# Patient Record
Sex: Male | Born: 1960 | Race: White | Hispanic: No | Marital: Married
Health system: Southern US, Community
[De-identification: ages and names within clinical notes are randomized; demographics above are authoritative.]

## PROBLEM LIST (undated history)

## (undated) DIAGNOSIS — R1312 Dysphagia, oropharyngeal phase: Secondary | ICD-10-CM

## (undated) DIAGNOSIS — U071 COVID-19: Secondary | ICD-10-CM

## (undated) DIAGNOSIS — A419 Sepsis, unspecified organism: Secondary | ICD-10-CM

## (undated) DIAGNOSIS — I10 Essential (primary) hypertension: Secondary | ICD-10-CM

## (undated) DIAGNOSIS — E119 Type 2 diabetes mellitus without complications: Secondary | ICD-10-CM

## (undated) DIAGNOSIS — E1140 Type 2 diabetes mellitus with diabetic neuropathy, unspecified: Secondary | ICD-10-CM

## (undated) DIAGNOSIS — J80 Acute respiratory distress syndrome: Secondary | ICD-10-CM

## (undated) DIAGNOSIS — I714 Abdominal aortic aneurysm, without rupture, unspecified: Secondary | ICD-10-CM

## (undated) DIAGNOSIS — R6520 Severe sepsis without septic shock: Secondary | ICD-10-CM

## (undated) DIAGNOSIS — J9621 Acute and chronic respiratory failure with hypoxia: Secondary | ICD-10-CM

## (undated) DIAGNOSIS — G7281 Critical illness myopathy: Secondary | ICD-10-CM

## (undated) DIAGNOSIS — K219 Gastro-esophageal reflux disease without esophagitis: Secondary | ICD-10-CM

## (undated) HISTORY — PX: LUMBAR FUSION: SHX111

## (undated) HISTORY — PX: VENTRAL HERNIA REPAIR: SHX424

---

## 2020-08-11 ENCOUNTER — Inpatient Hospital Stay
Admission: RE | Admit: 2020-08-11 | Discharge: 2020-08-30 | Disposition: A | Discharge status: Rehabilitation Facility | Payer: Medicare Other | Attending: Internal Medicine | Admitting: Internal Medicine

## 2020-08-11 ENCOUNTER — Other Ambulatory Visit (HOSPITAL_COMMUNITY): Payer: Medicare Other

## 2020-08-11 DIAGNOSIS — U071 COVID-19: Secondary | ICD-10-CM | POA: Diagnosis present

## 2020-08-11 DIAGNOSIS — G7281 Critical illness myopathy: Secondary | ICD-10-CM | POA: Diagnosis present

## 2020-08-11 DIAGNOSIS — Z8616 Personal history of COVID-19: Secondary | ICD-10-CM

## 2020-08-11 DIAGNOSIS — T85598A Other mechanical complication of other gastrointestinal prosthetic devices, implants and grafts, initial encounter: Secondary | ICD-10-CM

## 2020-08-11 DIAGNOSIS — J9621 Acute and chronic respiratory failure with hypoxia: Secondary | ICD-10-CM | POA: Diagnosis present

## 2020-08-11 DIAGNOSIS — J9690 Respiratory failure, unspecified, unspecified whether with hypoxia or hypercapnia: Secondary | ICD-10-CM

## 2020-08-11 DIAGNOSIS — R0602 Shortness of breath: Secondary | ICD-10-CM

## 2020-08-11 DIAGNOSIS — R1312 Dysphagia, oropharyngeal phase: Secondary | ICD-10-CM | POA: Diagnosis present

## 2020-08-11 DIAGNOSIS — J80 Acute respiratory distress syndrome: Secondary | ICD-10-CM | POA: Diagnosis present

## 2020-08-11 DIAGNOSIS — A419 Sepsis, unspecified organism: Secondary | ICD-10-CM | POA: Diagnosis present

## 2020-08-11 HISTORY — DX: Acute and chronic respiratory failure with hypoxia: J96.21

## 2020-08-11 HISTORY — DX: Severe sepsis without septic shock: R65.20

## 2020-08-11 HISTORY — DX: COVID-19: U07.1

## 2020-08-11 HISTORY — DX: Dysphagia, oropharyngeal phase: R13.12

## 2020-08-11 HISTORY — DX: Abdominal aortic aneurysm, without rupture, unspecified: I71.40

## 2020-08-11 HISTORY — DX: Acute respiratory distress syndrome: J80

## 2020-08-11 HISTORY — DX: Abdominal aortic aneurysm, without rupture: I71.4

## 2020-08-11 HISTORY — DX: Type 2 diabetes mellitus without complications: E11.9

## 2020-08-11 HISTORY — DX: Gastro-esophageal reflux disease without esophagitis: K21.9

## 2020-08-11 HISTORY — DX: Type 2 diabetes mellitus with diabetic neuropathy, unspecified: E11.40

## 2020-08-11 HISTORY — DX: Critical illness myopathy: G72.81

## 2020-08-11 HISTORY — DX: Essential (primary) hypertension: I10

## 2020-08-11 HISTORY — DX: Sepsis, unspecified organism: A41.9

## 2020-08-12 ENCOUNTER — Encounter: Payer: Self-pay | Admitting: Internal Medicine

## 2020-08-12 ENCOUNTER — Other Ambulatory Visit (HOSPITAL_COMMUNITY): Payer: Medicare Other

## 2020-08-12 DIAGNOSIS — U071 COVID-19: Secondary | ICD-10-CM | POA: Diagnosis present

## 2020-08-12 DIAGNOSIS — A419 Sepsis, unspecified organism: Secondary | ICD-10-CM | POA: Diagnosis present

## 2020-08-12 DIAGNOSIS — J9621 Acute and chronic respiratory failure with hypoxia: Secondary | ICD-10-CM | POA: Diagnosis present

## 2020-08-12 DIAGNOSIS — G7281 Critical illness myopathy: Secondary | ICD-10-CM | POA: Diagnosis not present

## 2020-08-12 DIAGNOSIS — R1312 Dysphagia, oropharyngeal phase: Secondary | ICD-10-CM | POA: Diagnosis not present

## 2020-08-12 DIAGNOSIS — R6520 Severe sepsis without septic shock: Secondary | ICD-10-CM

## 2020-08-12 DIAGNOSIS — J80 Acute respiratory distress syndrome: Secondary | ICD-10-CM | POA: Diagnosis present

## 2020-08-12 LAB — BASIC METABOLIC PANEL
Anion gap: 14 (ref 5–15)
BUN: 14 mg/dL (ref 6–20)
CO2: 23 mmol/L (ref 22–32)
Calcium: 8.8 mg/dL — ABNORMAL LOW (ref 8.9–10.3)
Chloride: 100 mmol/L (ref 98–111)
Creatinine, Ser: 0.63 mg/dL (ref 0.61–1.24)
GFR, Estimated: >60 mL/min (ref >60)
Glucose, Bld: 147 mg/dL — ABNORMAL HIGH (ref 70–99)
Potassium: 4.1 mmol/L (ref 3.5–5.1)
Sodium: 137 mmol/L (ref 135–145)

## 2020-08-12 LAB — CBC
HCT: 37.5 % — ABNORMAL LOW (ref 39.0–52.0)
Hemoglobin: 11.6 g/dL — ABNORMAL LOW (ref 13.0–17.0)
MCH: 27.5 pg (ref 26.0–34.0)
MCHC: 30.9 g/dL (ref 30.0–36.0)
MCV: 88.9 fL (ref 80.0–100.0)
Platelets: 307 10*3/uL (ref 150–400)
RBC: 4.22 MIL/uL (ref 4.22–5.81)
RDW: 15.1 % (ref 11.5–15.5)
WBC: 9.7 10*3/uL (ref 4.0–10.5)
nRBC: 0 % (ref 0.0–0.2)

## 2020-08-12 NOTE — Consult Note (Signed)
Pulmonary Critical Care Medicine [facility] [place]  PULMONARY SERVICE  Date of Service: 08/12/2020  PULMONARY CRITICAL CARE CONSULT   Willie Frederick  [id number]:[id number]  DOB: [birth year]   DOA: 08/11/2020  Referring Physician: Carron Curie, MD  HPI: Willie Frederick is a 59 y.o. male seen for follow up of Acute on Chronic Respiratory Failure.  Patient with multiple medical problems including hypertension diabetes diabetic neuropathy hyperlipidemia came into the hospital because of shortness of breath.  Patient also had another family member that was positive for COVID-19.  Then some cough and congestion which will been going on for about 2 weeks patient evaluated at that time came back positive for COVID-19.  On presentation to the ER saturations were about 85% so patient was admitted for further evaluation.  Hospital course is as follows.  Patient apparently had worsening of respiratory status was intubated placed on mechanical ventilation.  The patient was on the ventilator ET tube was dislodged had difficulty reintubating.  Subsequently patient ended up having to have a tracheostomy done.  Now is transferred for further management and weaning  Review of Systems:  ROS performed and is unremarkable other than noted above.  PAST MEDICAL AND PAST SURGICAL HISTORY: 1. Hypertension. 2. Hyperlipidemia. 3. Diabetes type 2. 4. Osteoarthritis. 5. History of lung disease. 6. History of gastroesophageal reflux disease. 7. History of wisdom tooth extraction. 8. History of ventral hernia repair. 9. History of lumbar spine fusion. 10. History of gout.  ALLERGIES: THE PATIENT NOTED TO BE ALLERGIC TO LYRICA AND CYMBALTA.  MEDICATIONS: 1. Doxycycline 100 mg p.o. b.i.d. 2. Albuterol HFA 2 puffs inhaled q.6 hours p.r.n. 3. Vitamin D3 5000 units p.o. every day. 4. Lipitor 40 mg p.o. at bedtime. 5. Alogliptin 25 mg p.o. every day. 6. Lisinopril 40 mg p.o. at bedtime. 7. Metformin  1000 mg p.o. b.i.d. 8. Glipizide 5 mg p.o. b.i.d. 9. Propranolol 80 mg p.o. at bedtime. 10. Allopurinol 300 mg p.o. every day.  SOCIAL HISTORY:  Tobacco use history: The patient denies.  Alcohol use history: The patient denies.  Illegal drug history: The patient denies.  Living condition: The patient lives alone.  FAMILY HISTORY: Significant for hypertension, coronary artery disease in several family members.   Physical Exam:  Vitals: Temperature is 96.7 pulse 102 respiratory rate 30 blood pressure is 137/75 saturations 98%  Ventilator Settings on T collar FiO2 35%  . General: Comfortable at this time . Eyes: Grossly normal lids, irises & conjunctiva . ENT: grossly tongue is normal . Neck: no obvious mass . Cardiovascular: S1-S2 normal no gallop or rub . Respiratory: No rhonchi very coarse breath sounds . Abdomen: Soft and nontender . Skin: no rash seen on limited exam . Musculoskeletal: not rigid . Psychiatric:unable to assess . Neurologic: no seizure no involuntary movements         Labs on Admission:  Basic Metabolic Panel: Recent Labs  Lab 08/12/20 0430  NA 137  K 4.1  CL 100  CO2 23  GLUCOSE 147*  BUN 14  CREATININE 0.63  CALCIUM 8.8*    No results for input(s): PHART, PCO2ART, PO2ART, HCO3, O2SAT in the last 168 hours.  Liver Function Tests: No results for input(s): AST, ALT, ALKPHOS, BILITOT, PROT, ALBUMIN in the last 168 hours. No results for input(s): LIPASE, AMYLASE in the last 168 hours. No results for input(s): AMMONIA in the last 168 hours.  CBC: Recent Labs  Lab 08/12/20 0430  WBC 9.7  HGB 11.6*  HCT 37.5*  MCV 88.9  PLT 307    Cardiac Enzymes: No results for input(s): CKTOTAL, CKMB, CKMBINDEX, TROPONINI in the last 168 hours.  BNP (last 3 results) No results for input(s): BNP in the last 8760 hours.  ProBNP (last 3 results) No results for input(s): PROBNP in the last 8760 hours.   Radiological Exams on Admission: DG Chest  Port 1 View  Result Date: 08/12/2020 CLINICAL DATA:  Respiratory failure EXAM: PORTABLE CHEST 1 VIEW COMPARISON:  None. FINDINGS: Tracheostomy in place overlying its expected position. Nasogastric tube extends into the upper abdomen overlying the expected mid body of the stomach. Lung volumes are small. There is focal consolidation within the right lung base peripherally, likely infectious or inflammatory. No pneumothorax or pleural effusion. Cardiac size within normal limits. Pulmonary vascularity is normal. No acute bone abnormality. IMPRESSION: Focal consolidation within the right lung base, possibly reflecting changes acute lobar pneumonia in the appropriate clinical setting. Pulmonary hypoinflation. Electronically Signed   By: Helyn Numbers MD   On: 08/12/2020 02:53   DG Abd Portable 1V  Result Date: 08/11/2020 CLINICAL DATA:  Impaired nasogastric feeding tube, initial encounter. Status post NG tube placement. EXAM: PORTABLE ABDOMEN - 1 VIEW COMPARISON:  None. FINDINGS: Tip and side port of the enteric tube are in the stomach. The enteric tube is looped in the stomach, tip in the region of the pylorus. There is no kink in the enteric tube. Mild gaseous gastric distension. Air within nondilated small and large bowel, nonobstructive. Patchy bilateral lung opacities in the included lung bases. IMPRESSION: Enteric tube looped in the stomach with the tip in the region of the pylorus. Electronically Signed   By: Narda Rutherford M.D.   On: 08/11/2020 20:19    Assessment/Plan Active Problems:   Acute on chronic respiratory failure with hypoxia (HCC)   COVID-19 virus infection   Oropharyngeal dysphagia   Severe sepsis (HCC)   Acute respiratory distress syndrome (ARDS) due to COVID-19 virus [facility])   Critical illness myopathy   1. Acute on chronic respiratory failure hypoxia plan is to continue with the T collar titrate oxygen continue pulmonary toilet secretions are fair to moderate. 2. COVID-19  virus infection recovery phase we will continue to follow along. 3. Oropharyngeal dysphagia possible vocal cord paralysis patient apparently had some dysphagia noted with gross aspiration on barium swallow from December 20 will need to have speech therapy evaluate. 4. Severe sepsis treated resolved we will continue to follow along. 5. ARDS due to COVID-19 treated 6. 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 The Patient requires high complexity decision making with multiple systems involvement.  Case was discussed on Rounds with the Respiratory Therapy Director and the Respiratory staff Time Spent  Yevonne Pax, MD [facility] Pulmonary Critical Care Medicine Sleep Medicine

## 2020-08-13 DIAGNOSIS — U071 COVID-19: Secondary | ICD-10-CM | POA: Diagnosis not present

## 2020-08-13 DIAGNOSIS — G7281 Critical illness myopathy: Secondary | ICD-10-CM | POA: Diagnosis not present

## 2020-08-13 DIAGNOSIS — R1312 Dysphagia, oropharyngeal phase: Secondary | ICD-10-CM | POA: Diagnosis not present

## 2020-08-13 DIAGNOSIS — J9621 Acute and chronic respiratory failure with hypoxia: Secondary | ICD-10-CM | POA: Diagnosis not present

## 2020-08-13 NOTE — Progress Notes (Signed)
Pulmonary Critical Care Medicine [facility] [place]   PULMONARY CRITICAL CARE SERVICE  PROGRESS NOTE  Date of Service: 08/13/2020  Willie Frederick  [id number]  DOB: 01/14/1961   DOA: 08/11/2020  Referring Physician: Carron Curie, MD  HPI: Willie Frederick is a 60 y.o. male seen for follow up of Acute on Chronic Respiratory Failure.  Remains on T collar currently on 35% FiO2 has a #8 tracheostomy in place ready for downsizing  Medications: Reviewed on Rounds  Physical Exam:  Vitals: Temperature is 97.6 pulse 95 respiratory 26 blood pressure is 130/75 saturations 92%  Ventilator Settings on T collar FiO2 35  . General: Comfortable at this time . Eyes: Grossly normal lids, irises & conjunctiva . ENT: grossly tongue is normal . Neck: no obvious mass . Cardiovascular: S1 S2 normal no gallop . Respiratory: No rhonchi no rales . Abdomen: soft . Skin: no rash seen on limited exam . Musculoskeletal: not rigid . Psychiatric:unable to assess . Neurologic: no seizure no involuntary movements         Lab Data:   Basic Metabolic Panel: Recent Labs  Lab 08/12/20 0430  NA 137  K 4.1  CL 100  CO2 23  GLUCOSE 147*  BUN 14  CREATININE 0.63  CALCIUM 8.8*    ABG: No results for input(s): PHART, PCO2ART, PO2ART, HCO3, O2SAT in the last 168 hours.  Liver Function Tests: No results for input(s): AST, ALT, ALKPHOS, BILITOT, PROT, ALBUMIN in the last 168 hours. No results for input(s): LIPASE, AMYLASE in the last 168 hours. No results for input(s): AMMONIA in the last 168 hours.  CBC: Recent Labs  Lab 08/12/20 0430  WBC 9.7  HGB 11.6*  HCT 37.5*  MCV 88.9  PLT 307    Cardiac Enzymes: No results for input(s): CKTOTAL, CKMB, CKMBINDEX, TROPONINI in the last 168 hours.  BNP (last 3 results) No results for input(s): BNP in the last 8760 hours.  ProBNP (last 3 results) No results for input(s): PROBNP in the last 8760 hours.  Radiological  Exams: DG Chest Port 1 View  Result Date: 08/12/2020 CLINICAL DATA:  Respiratory failure EXAM: PORTABLE CHEST 1 VIEW COMPARISON:  None. FINDINGS: Tracheostomy in place overlying its expected position. Nasogastric tube extends into the upper abdomen overlying the expected mid body of the stomach. Lung volumes are small. There is focal consolidation within the right lung base peripherally, likely infectious or inflammatory. No pneumothorax or pleural effusion. Cardiac size within normal limits. Pulmonary vascularity is normal. No acute bone abnormality. IMPRESSION: Focal consolidation within the right lung base, possibly reflecting changes acute lobar pneumonia in the appropriate clinical setting. Pulmonary hypoinflation. Electronically Signed   By: Helyn Numbers MD   On: 08/12/2020 02:53   DG Abd Portable 1V  Result Date: 08/11/2020 CLINICAL DATA:  Impaired nasogastric feeding tube, initial encounter. Status post NG tube placement. EXAM: PORTABLE ABDOMEN - 1 VIEW COMPARISON:  None. FINDINGS: Tip and side port of the enteric tube are in the stomach. The enteric tube is looped in the stomach, tip in the region of the pylorus. There is no kink in the enteric tube. Mild gaseous gastric distension. Air within nondilated small and large bowel, nonobstructive. Patchy bilateral lung opacities in the included lung bases. IMPRESSION: Enteric tube looped in the stomach with the tip in the region of the pylorus. Electronically Signed   By: Narda Rutherford M.D.   On: 08/11/2020 20:19    Assessment/Plan Active Problems:   Acute on chronic  respiratory failure with hypoxia (HCC)   COVID-19 virus infection   Oropharyngeal dysphagia   Severe sepsis (HCC)   Acute respiratory distress syndrome (ARDS) due to COVID-19 virus [facility])   Critical illness myopathy   1. Acute on chronic respiratory failure hypoxia continue T-piece titrate oxygen continue pulmonary toilet 2. COVID-19 virus infection in  recovery 3. Oropharyngeal dysphagia no change 4. Severe sepsis resolved 5. ARDS treated 6. Critical illness myopathy no change   I have personally seen and evaluated the patient, evaluated laboratory and imaging results, formulated the assessment and plan and placed orders. The Patient requires high complexity decision making with multiple systems involvement.  Rounds were done with the Respiratory Therapy Director and Staff therapists and discussed with nursing staff also.  Yevonne Pax, MD [facility] Pulmonary Critical Care Medicine Sleep Medicine

## 2020-08-14 DIAGNOSIS — R1312 Dysphagia, oropharyngeal phase: Secondary | ICD-10-CM | POA: Diagnosis not present

## 2020-08-14 DIAGNOSIS — G7281 Critical illness myopathy: Secondary | ICD-10-CM | POA: Diagnosis not present

## 2020-08-14 DIAGNOSIS — U071 COVID-19: Secondary | ICD-10-CM | POA: Diagnosis not present

## 2020-08-14 DIAGNOSIS — J9621 Acute and chronic respiratory failure with hypoxia: Secondary | ICD-10-CM | POA: Diagnosis not present

## 2020-08-14 NOTE — Progress Notes (Signed)
Pulmonary Critical Care Medicine [facility] [place]   PULMONARY CRITICAL CARE SERVICE  PROGRESS NOTE  Date of Service: 08/14/2020  Willie Frederick  [id number]  DOB: 05-25-1961   DOA: 08/11/2020  Referring Physician: Carron Curie, MD  HPI: Willie Frederick is a 60 y.o. male seen for follow up of Acute on Chronic Respiratory Failure.  Patient is on T collar currently 40% FiO2 good saturations are noted at this time  Medications: Reviewed on Rounds  Physical Exam:  Vitals: Temperature is 97.6 pulse 93 respiratory rate 30 blood pressure is 129/77 saturations 93%  Ventilator Settings on T collar with an FiO2 of 40%  . General: Comfortable at this time . Eyes: Grossly normal lids, irises & conjunctiva . ENT: grossly tongue is normal . Neck: no obvious mass . Cardiovascular: S1 S2 normal no gallop . Respiratory: No rhonchi no rales are noted at this time . Abdomen: soft . Skin: no rash seen on limited exam . Musculoskeletal: not rigid . Psychiatric:unable to assess . Neurologic: no seizure no involuntary movements         Lab Data:   Basic Metabolic Panel: Recent Labs  Lab 08/12/20 0430  NA 137  K 4.1  CL 100  CO2 23  GLUCOSE 147*  BUN 14  CREATININE 0.63  CALCIUM 8.8*    ABG: No results for input(s): PHART, PCO2ART, PO2ART, HCO3, O2SAT in the last 168 hours.  Liver Function Tests: No results for input(s): AST, ALT, ALKPHOS, BILITOT, PROT, ALBUMIN in the last 168 hours. No results for input(s): LIPASE, AMYLASE in the last 168 hours. No results for input(s): AMMONIA in the last 168 hours.  CBC: Recent Labs  Lab 08/12/20 0430  WBC 9.7  HGB 11.6*  HCT 37.5*  MCV 88.9  PLT 307    Cardiac Enzymes: No results for input(s): CKTOTAL, CKMB, CKMBINDEX, TROPONINI in the last 168 hours.  BNP (last 3 results) No results for input(s): BNP in the last 8760 hours.  ProBNP (last 3 results) No results for input(s): PROBNP in the last 8760  hours.  Radiological Exams: No results found.  Assessment/Plan Active Problems:   Acute on chronic respiratory failure with hypoxia (HCC)   COVID-19 virus infection   Oropharyngeal dysphagia   Severe sepsis (HCC)   Acute respiratory distress syndrome (ARDS) due to COVID-19 virus [facility])   Critical illness myopathy   1. Acute on chronic respiratory failure hypoxia we will continue with T-piece titrate oxygen continue pulmonary toilet. 2. COVID-19 virus infection recovery phase 3. Oropharyngeal dysphagia no change 4. Severe sepsis resolved 5. ARDS improving 6. Critical illness myopathy needs ongoing therapy   I have personally seen and evaluated the patient, evaluated laboratory and imaging results, formulated the assessment and plan and placed orders. The Patient requires high complexity decision making with multiple systems involvement.  Rounds were done with the Respiratory Therapy Director and Staff therapists and discussed with nursing staff also.  Yevonne Pax, MD [facility] Pulmonary Critical Care Medicine Sleep Medicine

## 2020-08-15 DIAGNOSIS — R1312 Dysphagia, oropharyngeal phase: Secondary | ICD-10-CM | POA: Diagnosis not present

## 2020-08-15 DIAGNOSIS — J9621 Acute and chronic respiratory failure with hypoxia: Secondary | ICD-10-CM | POA: Diagnosis not present

## 2020-08-15 DIAGNOSIS — U071 COVID-19: Secondary | ICD-10-CM | POA: Diagnosis not present

## 2020-08-15 DIAGNOSIS — G7281 Critical illness myopathy: Secondary | ICD-10-CM | POA: Diagnosis not present

## 2020-08-15 LAB — CBC
HCT: 36 % — ABNORMAL LOW (ref 39.0–52.0)
Hemoglobin: 11.9 g/dL — ABNORMAL LOW (ref 13.0–17.0)
MCH: 28.5 pg (ref 26.0–34.0)
MCHC: 33.1 g/dL (ref 30.0–36.0)
MCV: 86.3 fL (ref 80.0–100.0)
Platelets: 228 10*3/uL (ref 150–400)
RBC: 4.17 MIL/uL — ABNORMAL LOW (ref 4.22–5.81)
RDW: 15.5 % (ref 11.5–15.5)
WBC: 13.4 10*3/uL — ABNORMAL HIGH (ref 4.0–10.5)
nRBC: 0 % (ref 0.0–0.2)

## 2020-08-15 LAB — BASIC METABOLIC PANEL
Anion gap: 11 (ref 5–15)
BUN: 31 mg/dL — ABNORMAL HIGH (ref 6–20)
CO2: 28 mmol/L (ref 22–32)
Calcium: 9.4 mg/dL (ref 8.9–10.3)
Chloride: 101 mmol/L (ref 98–111)
Creatinine, Ser: 0.68 mg/dL (ref 0.61–1.24)
GFR, Estimated: >60 mL/min (ref >60)
Glucose, Bld: 337 mg/dL — ABNORMAL HIGH (ref 70–99)
Potassium: 4.8 mmol/L (ref 3.5–5.1)
Sodium: 140 mmol/L (ref 135–145)

## 2020-08-15 NOTE — Progress Notes (Signed)
Pulmonary Critical Care Medicine [facility] [place]   PULMONARY CRITICAL CARE SERVICE  PROGRESS NOTE  Date of Service: 08/15/2020  Willie Frederick  [id number]  DOB: 1961-01-31   DOA: 08/11/2020  Referring Physician: Carron Curie, MD  HPI: Willie Frederick is a 60 y.o. male seen for follow up of Acute on Chronic Respiratory Failure.  On T collar right now on 40% FiO2 good saturations are noted at this time  Medications: Reviewed on Rounds  Physical Exam:  Vitals: Temperature is 97.1 pulse 84 respiratory 22 blood pressure is 125/85 saturations 94%  Ventilator Settings patient is on 40% FiO2  . General: Comfortable at this time . Eyes: Grossly normal lids, irises & conjunctiva . ENT: grossly tongue is normal . Neck: no obvious mass . Cardiovascular: S1 S2 normal no gallop . Respiratory: No rhonchi no rales noted at this time . Abdomen: soft . Skin: no rash seen on limited exam . Musculoskeletal: not rigid . Psychiatric:unable to assess . Neurologic: no seizure no involuntary movements         Lab Data:   Basic Metabolic Panel: Recent Labs  Lab 08/12/20 0430 08/15/20 0504  NA 137 140  K 4.1 4.8  CL 100 101  CO2 23 28  GLUCOSE 147* 337*  BUN 14 31*  CREATININE 0.63 0.68  CALCIUM 8.8* 9.4    ABG: No results for input(s): PHART, PCO2ART, PO2ART, HCO3, O2SAT in the last 168 hours.  Liver Function Tests: No results for input(s): AST, ALT, ALKPHOS, BILITOT, PROT, ALBUMIN in the last 168 hours. No results for input(s): LIPASE, AMYLASE in the last 168 hours. No results for input(s): AMMONIA in the last 168 hours.  CBC: Recent Labs  Lab 08/12/20 0430 08/15/20 0504  WBC 9.7 13.4*  HGB 11.6* 11.9*  HCT 37.5* 36.0*  MCV 88.9 86.3  PLT 307 228    Cardiac Enzymes: No results for input(s): CKTOTAL, CKMB, CKMBINDEX, TROPONINI in the last 168 hours.  BNP (last 3 results) No results for input(s): BNP in the last 8760 hours.  ProBNP (last 3  results) No results for input(s): PROBNP in the last 8760 hours.  Radiological Exams: No results found.  Assessment/Plan Active Problems:   Acute on chronic respiratory failure with hypoxia (HCC)   COVID-19 virus infection   Oropharyngeal dysphagia   Severe sepsis (HCC)   Acute respiratory distress syndrome (ARDS) due to COVID-19 virus [facility])   Critical illness myopathy   1. Acute on chronic respiratory failure hypoxia we will continue with T collar titrate oxygen continue pulmonary toilet 2. COVID-19 virus infection recovery 3. Oropharyngeal dysphagia no change 4. Severe sepsis resolved 5. ARDS treated 6. Critical illness myopathy no change   I have personally seen and evaluated the patient, evaluated laboratory and imaging results, formulated the assessment and plan and placed orders. The Patient requires high complexity decision making with multiple systems involvement.  Rounds were done with the Respiratory Therapy Director and Staff therapists and discussed with nursing staff also.  Yevonne Pax, MD [facility] Pulmonary Critical Care Medicine Sleep Medicine

## 2020-08-16 DIAGNOSIS — G7281 Critical illness myopathy: Secondary | ICD-10-CM | POA: Diagnosis not present

## 2020-08-16 DIAGNOSIS — U071 COVID-19: Secondary | ICD-10-CM | POA: Diagnosis not present

## 2020-08-16 DIAGNOSIS — R1312 Dysphagia, oropharyngeal phase: Secondary | ICD-10-CM | POA: Diagnosis not present

## 2020-08-16 DIAGNOSIS — J9621 Acute and chronic respiratory failure with hypoxia: Secondary | ICD-10-CM | POA: Diagnosis not present

## 2020-08-16 NOTE — Progress Notes (Signed)
Pulmonary Critical Care Medicine [facility] [place]   PULMONARY CRITICAL CARE SERVICE  PROGRESS NOTE  Date of Service: 08/16/2020  Willie Frederick  [id number]  DOB: [birth year]   DOA: 08/11/2020  Referring Physician: Carron Curie, MD  HPI: Willie Frederick is a 60 y.o. male seen for follow up of Acute on Chronic Respiratory Failure.  Patient currently is on T collar has been on 40% FiO2 secretions are moderate  Medications: Reviewed on Rounds  Physical Exam:  Vitals: Temperature is 97.4 pulse 80 respiratory rate 20 blood pressure is 142/81 saturations 93%  Ventilator Settings on T collar with an FiO2 40%  . General: Comfortable at this time . Eyes: Grossly normal lids, irises & conjunctiva . ENT: grossly tongue is normal . Neck: no obvious mass . Cardiovascular: S1 S2 normal no gallop . Respiratory: No rhonchi no rales noted at this . Abdomen: soft . Skin: no rash seen on limited exam . Musculoskeletal: not rigid . Psychiatric:unable to assess . Neurologic: no seizure no involuntary movements         Lab Data:   Basic Metabolic Panel: Recent Labs  Lab 08/12/20 0430 08/15/20 0504  NA 137 140  K 4.1 4.8  CL 100 101  CO2 23 28  GLUCOSE 147* 337*  BUN 14 31*  CREATININE 0.63 0.68  CALCIUM 8.8* 9.4    ABG: No results for input(s): PHART, PCO2ART, PO2ART, HCO3, O2SAT in the last 168 hours.  Liver Function Tests: No results for input(s): AST, ALT, ALKPHOS, BILITOT, PROT, ALBUMIN in the last 168 hours. No results for input(s): LIPASE, AMYLASE in the last 168 hours. No results for input(s): AMMONIA in the last 168 hours.  CBC: Recent Labs  Lab 08/12/20 0430 08/15/20 0504  WBC 9.7 13.4*  HGB 11.6* 11.9*  HCT 37.5* 36.0*  MCV 88.9 86.3  PLT 307 228    Cardiac Enzymes: No results for input(s): CKTOTAL, CKMB, CKMBINDEX, TROPONINI in the last 168 hours.  BNP (last 3 results) No results for input(s): BNP in the last 8760  hours.  ProBNP (last 3 results) No results for input(s): PROBNP in the last 8760 hours.  Radiological Exams: No results found.  Assessment/Plan Active Problems:   Acute on chronic respiratory failure with hypoxia (HCC)   COVID-19 virus infection   Oropharyngeal dysphagia   Severe sepsis (HCC)   Acute respiratory distress syndrome (ARDS) due to COVID-19 virus [facility])   Critical illness myopathy   1. Acute on chronic respiratory failure hypoxia we will continue with T-piece titrate oxygen continue pulmonary toilet. 2. COVID-19 virus infection recovery phase 3. Oropharyngeal dysphagia patient is at baseline we will continue with supportive care 4. Severe sepsis resolved 5. ARDS treated 6. Critical illness myopathy slow improvement   I have personally seen and evaluated the patient, evaluated laboratory and imaging results, formulated the assessment and plan and placed orders. The Patient requires high complexity decision making with multiple systems involvement.  Rounds were done with the Respiratory Therapy Director and Staff therapists and discussed with nursing staff also.  Yevonne Pax, MD [facility] Pulmonary Critical Care Medicine Sleep Medicine

## 2020-08-17 ENCOUNTER — Other Ambulatory Visit (HOSPITAL_COMMUNITY): Payer: Medicare Other

## 2020-08-17 DIAGNOSIS — R1312 Dysphagia, oropharyngeal phase: Secondary | ICD-10-CM | POA: Diagnosis not present

## 2020-08-17 DIAGNOSIS — U071 COVID-19: Secondary | ICD-10-CM | POA: Diagnosis not present

## 2020-08-17 DIAGNOSIS — G7281 Critical illness myopathy: Secondary | ICD-10-CM | POA: Diagnosis not present

## 2020-08-17 DIAGNOSIS — J9621 Acute and chronic respiratory failure with hypoxia: Secondary | ICD-10-CM | POA: Diagnosis not present

## 2020-08-17 NOTE — Progress Notes (Signed)
Pulmonary Critical Care Medicine [facility] [place]   PULMONARY CRITICAL CARE SERVICE  PROGRESS NOTE  Date of Service: 08/17/2020  Willie Frederick  [id number]  DOB: 1960/08/20   DOA: 08/11/2020  Referring Physician: Carron Curie, MD  HPI: Willie Frederick is a 60 y.o. male seen for follow up of Acute on Chronic Respiratory Failure.  Patient is on T collar currently on 40% FiO2 with good saturations  Medications: Reviewed on Rounds  Physical Exam:  Vitals: Temperature is 97.6 pulse 93 respiratory 22 blood pressure is 120/83 saturations 97%  Ventilator Settings on T collar FiO2 40%  . General: Comfortable at this time . Eyes: Grossly normal lids, irises & conjunctiva . ENT: grossly tongue is normal . Neck: no obvious mass . Cardiovascular: S1 S2 normal no gallop . Respiratory: Scattered rhonchi expansion is equal . Abdomen: soft . Skin: no rash seen on limited exam . Musculoskeletal: not rigid . Psychiatric:unable to assess . Neurologic: no seizure no involuntary movements         Lab Data:   Basic Metabolic Panel: Recent Labs  Lab 08/12/20 0430 08/15/20 0504  NA 137 140  K 4.1 4.8  CL 100 101  CO2 23 28  GLUCOSE 147* 337*  BUN 14 31*  CREATININE 0.63 0.68  CALCIUM 8.8* 9.4    ABG: No results for input(s): PHART, PCO2ART, PO2ART, HCO3, O2SAT in the last 168 hours.  Liver Function Tests: No results for input(s): AST, ALT, ALKPHOS, BILITOT, PROT, ALBUMIN in the last 168 hours. No results for input(s): LIPASE, AMYLASE in the last 168 hours. No results for input(s): AMMONIA in the last 168 hours.  CBC: Recent Labs  Lab 08/12/20 0430 08/15/20 0504  WBC 9.7 13.4*  HGB 11.6* 11.9*  HCT 37.5* 36.0*  MCV 88.9 86.3  PLT 307 228    Cardiac Enzymes: No results for input(s): CKTOTAL, CKMB, CKMBINDEX, TROPONINI in the last 168 hours.  BNP (last 3 results) No results for input(s): BNP in the last 8760 hours.  ProBNP (last 3  results) No results for input(s): PROBNP in the last 8760 hours.  Radiological Exams: No results found.  Assessment/Plan Active Problems:   Acute on chronic respiratory failure with hypoxia (HCC)   COVID-19 virus infection   Oropharyngeal dysphagia   Severe sepsis (HCC)   Acute respiratory distress syndrome (ARDS) due to COVID-19 virus [facility])   Critical illness myopathy   1. Acute on chronic respiratory failure with hypoxia we will continue with T collar trials titrate oxygen continue pulmonary toilet. 2. COVID-19 virus infection recovery phase we will continue to monitor 3. Oropharyngeal dysphagia patient is at baseline 4. Severe sepsis resolved 5. ARDS secondary to the COVID-19 slow improvement 6. Critical illness myopathy physical therapy as tolerated   I have personally seen and evaluated the patient, evaluated laboratory and imaging results, formulated the assessment and plan and placed orders. The Patient requires high complexity decision making with multiple systems involvement.  Rounds were done with the Respiratory Therapy Director and Staff therapists and discussed with nursing staff also.  Yevonne Pax, MD [facility] Pulmonary Critical Care Medicine Sleep Medicine

## 2020-08-18 DIAGNOSIS — U071 COVID-19: Secondary | ICD-10-CM | POA: Diagnosis not present

## 2020-08-18 DIAGNOSIS — R1312 Dysphagia, oropharyngeal phase: Secondary | ICD-10-CM | POA: Diagnosis not present

## 2020-08-18 DIAGNOSIS — J9621 Acute and chronic respiratory failure with hypoxia: Secondary | ICD-10-CM | POA: Diagnosis not present

## 2020-08-18 DIAGNOSIS — G7281 Critical illness myopathy: Secondary | ICD-10-CM | POA: Diagnosis not present

## 2020-08-18 NOTE — Progress Notes (Signed)
Pulmonary Critical Care Medicine [facility] [place]   PULMONARY CRITICAL CARE SERVICE  PROGRESS NOTE  Date of Service: 08/18/2020  Willie Frederick  [id number]  DOB: [birth year]   DOA: 08/11/2020  Referring Physician: Carron Curie, MD  HPI: Willie Frederick is a 60 y.o. male seen for follow up of Acute on Chronic Respiratory Failure.  Patient is currently on T collar with saturations are noted at this time.  Medications: Reviewed on Rounds  Physical Exam:  Vitals: Temperature 97.6 pulse 76 respiratory 18 blood pressure is 122/84 saturations 95%  Ventilator Settings on T collar off the  . General: Comfortable at this time . Eyes: Grossly normal lids, irises & conjunctiva . ENT: grossly tongue is normal . Neck: no obvious mass . Cardiovascular: S1 S2 normal no gallop . Respiratory: No rhonchi or rales noted at this . Abdomen: soft . Skin: no rash seen on limited exam . Musculoskeletal: not rigid . Psychiatric:unable to assess . Neurologic: no seizure no involuntary movements         Lab Data:   Basic Metabolic Panel: Recent Labs  Lab 08/12/20 0430 08/15/20 0504  NA 137 140  K 4.1 4.8  CL 100 101  CO2 23 28  GLUCOSE 147* 337*  BUN 14 31*  CREATININE 0.63 0.68  CALCIUM 8.8* 9.4    ABG: No results for input(s): PHART, PCO2ART, PO2ART, HCO3, O2SAT in the last 168 hours.  Liver Function Tests: No results for input(s): AST, ALT, ALKPHOS, BILITOT, PROT, ALBUMIN in the last 168 hours. No results for input(s): LIPASE, AMYLASE in the last 168 hours. No results for input(s): AMMONIA in the last 168 hours.  CBC: Recent Labs  Lab 08/12/20 0430 08/15/20 0504  WBC 9.7 13.4*  HGB 11.6* 11.9*  HCT 37.5* 36.0*  MCV 88.9 86.3  PLT 307 228    Cardiac Enzymes: No results for input(s): CKTOTAL, CKMB, CKMBINDEX, TROPONINI in the last 168 hours.  BNP (last 3 results) No results for input(s): BNP in the last 8760 hours.  ProBNP (last 3  results) No results for input(s): PROBNP in the last 8760 hours.  Radiological Exams: No results found.  Assessment/Plan Active Problems:   Acute on chronic respiratory failure with hypoxia (HCC)   COVID-19 virus infection   Oropharyngeal dysphagia   Severe sepsis (HCC)   Acute respiratory distress syndrome (ARDS) due to COVID-19 virus [facility])   Critical illness myopathy   1. Acute on chronic respiratory failure hypoxia we will continue with T-piece titrate oxygen continue pulmonary toilet. 2. COVID-19 virus infection recovery 3. Severe sepsis resolved 4. ARDS improving slowly 5. Oropharyngeal dysphagia continue with supportive care   I have personally seen and evaluated the patient, evaluated laboratory and imaging results, formulated the assessment and plan and placed orders. The Patient requires high complexity decision making with multiple systems involvement.  Rounds were done with the Respiratory Therapy Director and Staff therapists and discussed with nursing staff also.  Yevonne Pax, MD [facility] Pulmonary Critical Care Medicine Sleep Medicine

## 2020-08-19 DIAGNOSIS — J9621 Acute and chronic respiratory failure with hypoxia: Secondary | ICD-10-CM | POA: Diagnosis not present

## 2020-08-19 DIAGNOSIS — R1312 Dysphagia, oropharyngeal phase: Secondary | ICD-10-CM | POA: Diagnosis not present

## 2020-08-19 DIAGNOSIS — G7281 Critical illness myopathy: Secondary | ICD-10-CM | POA: Diagnosis not present

## 2020-08-19 DIAGNOSIS — U071 COVID-19: Secondary | ICD-10-CM | POA: Diagnosis not present

## 2020-08-19 NOTE — Progress Notes (Signed)
Pulmonary Critical Care Medicine [facility] [place]   PULMONARY CRITICAL CARE SERVICE  PROGRESS NOTE  Date of Service: 08/19/2020  Willie Frederick  [id number]:[id number]  DOB: Jun 25, 1961   DOA: 08/11/2020  Referring Physician: Carron Curie, MD  HPI: Willie Frederick is a 60 y.o. male seen for follow up of Acute on Chronic Respiratory Failure.  Patient currently is on T collar has been on 40% FiO2 using the PMV  Medications: Reviewed on Rounds  Physical Exam:  Vitals: Temperature is 98.0 pulse 68 respiratory rate 18 blood pressure is 123/81 saturations 94%  Ventilator Settings on T collar with an FiO2 of 40%  . General: Comfortable at this time . Eyes: Grossly normal lids, irises & conjunctiva . ENT: grossly tongue is normal . Neck: no obvious mass . Cardiovascular: S1 S2 normal no gallop . Respiratory: Scattered rhonchi expansion is equal . Abdomen: soft . Skin: no rash seen on limited exam . Musculoskeletal: not rigid . Psychiatric:unable to assess . Neurologic: no seizure no involuntary movements         Lab Data:   Basic Metabolic Panel: Recent Labs  Lab 08/15/20 0504  NA 140  K 4.8  CL 101  CO2 28  GLUCOSE 337*  BUN 31*  CREATININE 0.68  CALCIUM 9.4    ABG: No results for input(s): PHART, PCO2ART, PO2ART, HCO3, O2SAT in the last 168 hours.  Liver Function Tests: No results for input(s): AST, ALT, ALKPHOS, BILITOT, PROT, ALBUMIN in the last 168 hours. No results for input(s): LIPASE, AMYLASE in the last 168 hours. No results for input(s): AMMONIA in the last 168 hours.  CBC: Recent Labs  Lab 08/15/20 0504  WBC 13.4*  HGB 11.9*  HCT 36.0*  MCV 86.3  PLT 228    Cardiac Enzymes: No results for input(s): CKTOTAL, CKMB, CKMBINDEX, TROPONINI in the last 168 hours.  BNP (last 3 results) No results for input(s): BNP in the last 8760 hours.  ProBNP (last 3 results) No results for input(s): PROBNP in the last 8760 hours.  Radiological  Exams: No results found.  Assessment/Plan Active Problems:   Acute on chronic respiratory failure with hypoxia (HCC)   COVID-19 virus infection   Oropharyngeal dysphagia   Severe sepsis (HCC)   Acute respiratory distress syndrome (ARDS) due to COVID-19 virus [facility])   Critical illness myopathy   1. Acute on chronic respiratory failure with hypoxia patient is on T collar using PMV. 2. COVID-19 virus infection in recovery 3. Oropharyngeal dysphagia no change 4. Severe sepsis resolved 5. ARDS slow improvement 6. 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 The Patient requires high complexity decision making with multiple systems involvement.  Rounds were done with the Respiratory Therapy Director and Staff therapists and discussed with nursing staff also.  Yevonne Pax, MD [facility] Pulmonary Critical Care Medicine Sleep Medicine

## 2020-08-20 DIAGNOSIS — U071 COVID-19: Secondary | ICD-10-CM | POA: Diagnosis not present

## 2020-08-20 DIAGNOSIS — R1312 Dysphagia, oropharyngeal phase: Secondary | ICD-10-CM | POA: Diagnosis not present

## 2020-08-20 DIAGNOSIS — J9621 Acute and chronic respiratory failure with hypoxia: Secondary | ICD-10-CM | POA: Diagnosis not present

## 2020-08-20 DIAGNOSIS — G7281 Critical illness myopathy: Secondary | ICD-10-CM | POA: Diagnosis not present

## 2020-08-20 LAB — CBC
HCT: 36.4 % — ABNORMAL LOW (ref 39.0–52.0)
Hemoglobin: 12.2 g/dL — ABNORMAL LOW (ref 13.0–17.0)
MCH: 29.2 pg (ref 26.0–34.0)
MCHC: 33.5 g/dL (ref 30.0–36.0)
MCV: 87.1 fL (ref 80.0–100.0)
Platelets: 154 10*3/uL (ref 150–400)
RBC: 4.18 MIL/uL — ABNORMAL LOW (ref 4.22–5.81)
RDW: 16.2 % — ABNORMAL HIGH (ref 11.5–15.5)
WBC: 9.3 10*3/uL (ref 4.0–10.5)
nRBC: 0 % (ref 0.0–0.2)

## 2020-08-20 LAB — BASIC METABOLIC PANEL
Anion gap: 11 (ref 5–15)
BUN: 24 mg/dL — ABNORMAL HIGH (ref 6–20)
CO2: 29 mmol/L (ref 22–32)
Calcium: 8.3 mg/dL — ABNORMAL LOW (ref 8.9–10.3)
Chloride: 97 mmol/L — ABNORMAL LOW (ref 98–111)
Creatinine, Ser: 0.59 mg/dL — ABNORMAL LOW (ref 0.61–1.24)
GFR, Estimated: >60 mL/min (ref >60)
Glucose, Bld: 184 mg/dL — ABNORMAL HIGH (ref 70–99)
Potassium: 4.3 mmol/L (ref 3.5–5.1)
Sodium: 137 mmol/L (ref 135–145)

## 2020-08-20 LAB — MAGNESIUM: Magnesium: 2 mg/dL (ref 1.7–2.4)

## 2020-08-20 NOTE — Progress Notes (Signed)
Pulmonary Critical Care Medicine [facility] [place]   PULMONARY CRITICAL CARE SERVICE  PROGRESS NOTE  Date of Service: 08/20/2020  Willie Frederick  [id number]  DOB: 04/14/1961   DOA: 08/11/2020  Referring Physician: Carron Curie, MD  HPI: Willie Frederick is a 60 y.o. male seen for follow up of Acute on Chronic Respiratory Failure.  Patient currently is on T collar has been on 35% FiO2 using PMV  Medications: Reviewed on Rounds  Physical Exam:  Vitals: Temperature is 96.8 pulse 80 respiratory rate 18 blood pressure is 138/86 saturations 97%  Ventilator Settings on T collar with an FiO2 of 35%  . General: Comfortable at this time . Eyes: Grossly normal lids, irises & conjunctiva . ENT: grossly tongue is normal . Neck: no obvious mass . Cardiovascular: S1 S2 normal no gallop . Respiratory: No rhonchi very coarse breath sound . Abdomen: soft . Skin: no rash seen on limited exam . Musculoskeletal: not rigid . Psychiatric:unable to assess . Neurologic: no seizure no involuntary movements         Lab Data:   Basic Metabolic Panel: Recent Labs  Lab 08/15/20 0504 08/20/20 0607  NA 140 137  K 4.8 4.3  CL 101 97*  CO2 28 29  GLUCOSE 337* 184*  BUN 31* 24*  CREATININE 0.68 0.59*  CALCIUM 9.4 8.3*  MG  --  2.0    ABG: No results for input(s): PHART, PCO2ART, PO2ART, HCO3, O2SAT in the last 168 hours.  Liver Function Tests: No results for input(s): AST, ALT, ALKPHOS, BILITOT, PROT, ALBUMIN in the last 168 hours. No results for input(s): LIPASE, AMYLASE in the last 168 hours. No results for input(s): AMMONIA in the last 168 hours.  CBC: Recent Labs  Lab 08/15/20 0504 08/20/20 0607  WBC 13.4* 9.3  HGB 11.9* 12.2*  HCT 36.0* 36.4*  MCV 86.3 87.1  PLT 228 154    Cardiac Enzymes: No results for input(s): CKTOTAL, CKMB, CKMBINDEX, TROPONINI in the last 168 hours.  BNP (last 3 results) No results for input(s): BNP in the last 8760  hours.  ProBNP (last 3 results) No results for input(s): PROBNP in the last 8760 hours.  Radiological Exams: No results found.  Assessment/Plan Active Problems:   Acute on chronic respiratory failure with hypoxia (HCC)   COVID-19 virus infection   Oropharyngeal dysphagia   Severe sepsis (HCC)   Acute respiratory distress syndrome (ARDS) due to COVID-19 virus [place])   Critical illness myopathy   1. Acute on chronic respiratory failure hypoxia we will continue with T collar trials patient is on PMV 2. COVID-19 virus infection recovery 3. Oropharyngeal dysphagia no change we will continue to follow 4. Severe sepsis resolved 5. ARDS treated slowly improving 6. Critical illness myopathy no change we will continue to follow along   I have personally seen and evaluated the patient, evaluated laboratory and imaging results, formulated the assessment and plan and placed orders. The Patient requires high complexity decision making with multiple systems involvement.  Rounds were done with the Respiratory Therapy Director and Staff therapists and discussed with nursing staff also.  Yevonne Pax, MD [facility] Pulmonary Critical Care Medicine Sleep Medicine

## 2020-08-21 ENCOUNTER — Other Ambulatory Visit (HOSPITAL_COMMUNITY): Payer: Medicare Other

## 2020-08-21 DIAGNOSIS — G7281 Critical illness myopathy: Secondary | ICD-10-CM | POA: Diagnosis not present

## 2020-08-21 DIAGNOSIS — J9621 Acute and chronic respiratory failure with hypoxia: Secondary | ICD-10-CM | POA: Diagnosis not present

## 2020-08-21 DIAGNOSIS — R1312 Dysphagia, oropharyngeal phase: Secondary | ICD-10-CM | POA: Diagnosis not present

## 2020-08-21 DIAGNOSIS — U071 COVID-19: Secondary | ICD-10-CM | POA: Diagnosis not present

## 2020-08-21 NOTE — Progress Notes (Signed)
Pulmonary Critical Care Medicine [facility] [place]   PULMONARY CRITICAL CARE SERVICE  PROGRESS NOTE  Date of Service: 08/21/2020  Willie Frederick  [id number]  DOB: [birth year]   DOA: 08/11/2020  Referring Physician: Carron Curie, MD  HPI: Willie Frederick is a 60 y.o. male seen for follow up of Acute on Chronic Respiratory Failure.  Patient is currently off the ventilator on T collar has been on 35% FiO2  Medications: Reviewed on Rounds  Physical Exam:  Vitals: Temperature is 97.0 pulse 76 respiratory rate 19 blood pressure is 136/90 saturations 94%  Ventilator Settings on T collar FiO2 35%  . General: Comfortable at this time . Eyes: Grossly normal lids, irises & conjunctiva . ENT: grossly tongue is normal . Neck: no obvious mass . Cardiovascular: S1 S2 normal no gallop . Respiratory: No rhonchi no rales are noted at this time . Abdomen: soft . Skin: no rash seen on limited exam . Musculoskeletal: not rigid . Psychiatric:unable to assess . Neurologic: no seizure no involuntary movements         Lab Data:   Basic Metabolic Panel: Recent Labs  Lab 08/15/20 0504 08/20/20 0607  NA 140 137  K 4.8 4.3  CL 101 97*  CO2 28 29  GLUCOSE 337* 184*  BUN 31* 24*  CREATININE 0.68 0.59*  CALCIUM 9.4 8.3*  MG  --  2.0    ABG: No results for input(s): PHART, PCO2ART, PO2ART, HCO3, O2SAT in the last 168 hours.  Liver Function Tests: No results for input(s): AST, ALT, ALKPHOS, BILITOT, PROT, ALBUMIN in the last 168 hours. No results for input(s): LIPASE, AMYLASE in the last 168 hours. No results for input(s): AMMONIA in the last 168 hours.  CBC: Recent Labs  Lab 08/15/20 0504 08/20/20 0607  WBC 13.4* 9.3  HGB 11.9* 12.2*  HCT 36.0* 36.4*  MCV 86.3 87.1  PLT 228 154    Cardiac Enzymes: No results for input(s): CKTOTAL, CKMB, CKMBINDEX, TROPONINI in the last 168 hours.  BNP (last 3 results) No results for input(s): BNP in the last 8760  hours.  ProBNP (last 3 results) No results for input(s): PROBNP in the last 8760 hours.  Radiological Exams: No results found.  Assessment/Plan Active Problems:   Acute on chronic respiratory failure with hypoxia (HCC)   COVID-19 virus infection   Oropharyngeal dysphagia   Severe sepsis (HCC)   Acute respiratory distress syndrome (ARDS) due to COVID-19 virus [facility])   Critical illness myopathy   1. Acute on chronic respiratory failure hypoxia we will continue with T collar trials titrate oxygen continue pulmonary toilet. 2. Oropharyngeal dysphagia no change we will continue with supportive care 3. COVID-19 virus infection recovery 4. Severe sepsis resolved 5. ARDS slow improvement 6. Critical illness myopathy therapy as tolerated   I have personally seen and evaluated the patient, evaluated laboratory and imaging results, formulated the assessment and plan and placed orders. The Patient requires high complexity decision making with multiple systems involvement.  Rounds were done with the Respiratory Therapy Director and Staff therapists and discussed with nursing staff also.  Yevonne Pax, MD [facility] Pulmonary Critical Care Medicine Sleep Medicine

## 2020-08-22 DIAGNOSIS — U071 COVID-19: Secondary | ICD-10-CM | POA: Diagnosis not present

## 2020-08-22 DIAGNOSIS — G7281 Critical illness myopathy: Secondary | ICD-10-CM | POA: Diagnosis not present

## 2020-08-22 DIAGNOSIS — R1312 Dysphagia, oropharyngeal phase: Secondary | ICD-10-CM | POA: Diagnosis not present

## 2020-08-22 DIAGNOSIS — J9621 Acute and chronic respiratory failure with hypoxia: Secondary | ICD-10-CM | POA: Diagnosis not present

## 2020-08-22 NOTE — Progress Notes (Signed)
Pulmonary Critical Care Medicine [facility] [place]   PULMONARY CRITICAL CARE SERVICE  PROGRESS NOTE  Date of Service: 08/22/2020  Willie Frederick  [id number]  DOB: [birth year]   DOA: 08/11/2020  Referring Physician: Carron Curie, MD  HPI: Willie Frederick is a 60 y.o. male seen for follow up of Acute on Chronic Respiratory Failure.  Patient is on T collar currently on 40% FiO2 has been using the PMV  Medications: Reviewed on Rounds  Physical Exam:  Vitals: Temperature is 97.0 pulse 73 respiratory rate 22 blood pressure is 156/97 saturations 99%  Ventilator Settings off the ventilator on T collar  . General: Comfortable at this time . Eyes: Grossly normal lids, irises & conjunctiva . ENT: grossly tongue is normal . Neck: no obvious mass . Cardiovascular: S1 S2 normal no gallop . Respiratory: No rhonchi or rales noted at this time . Abdomen: soft . Skin: no rash seen on limited exam . Musculoskeletal: not rigid . Psychiatric:unable to assess . Neurologic: no seizure no involuntary movements         Lab Data:   Basic Metabolic Panel: Recent Labs  Lab 08/20/20 0607  NA 137  K 4.3  CL 97*  CO2 29  GLUCOSE 184*  BUN 24*  CREATININE 0.59*  CALCIUM 8.3*  MG 2.0    ABG: No results for input(s): PHART, PCO2ART, PO2ART, HCO3, O2SAT in the last 168 hours.  Liver Function Tests: No results for input(s): AST, ALT, ALKPHOS, BILITOT, PROT, ALBUMIN in the last 168 hours. No results for input(s): LIPASE, AMYLASE in the last 168 hours. No results for input(s): AMMONIA in the last 168 hours.  CBC: Recent Labs  Lab 08/20/20 0607  WBC 9.3  HGB 12.2*  HCT 36.4*  MCV 87.1  PLT 154    Cardiac Enzymes: No results for input(s): CKTOTAL, CKMB, CKMBINDEX, TROPONINI in the last 168 hours.  BNP (last 3 results) No results for input(s): BNP in the last 8760 hours.  ProBNP (last 3 results) No results for input(s): PROBNP in the last 8760  hours.  Radiological Exams: DG CHEST PORT 1 VIEW  Result Date: 08/21/2020 CLINICAL DATA:  Evaluate tracheal tube. Focal consolidation in right lung. EXAM: PORTABLE CHEST 1 VIEW COMPARISON:  August 12, 2020 FINDINGS: The tracheostomy tube is in good position. The NG tube is been removed. Persistent focal opacity in the periphery of the right lung base. Mild opacity in left base may represent atelectasis. No other interval changes. IMPRESSION: 1. Persistent focal opacity in the periphery of the right lung base. 2. Mild atelectasis in the left base. 3. Tracheostomy tube projects over the tracheal air column. Electronically Signed   By: Gerome Sam III M.D   On: 08/21/2020 16:56    Assessment/Plan Active Problems:   Acute on chronic respiratory failure with hypoxia (HCC)   COVID-19 virus infection   Oropharyngeal dysphagia   Severe sepsis (HCC)   Acute respiratory distress syndrome (ARDS) due to COVID-19 virus [facility])   Critical illness myopathy   1. Acute on chronic respiratory failure hypoxia we will continue with T-piece use PMV as ordered. 2. COVID-19 virus infection in recovery 3. Oropharyngeal dysphagia no change we will continue to follow 4. Severe sepsis resolved 5. ARDS treated improving 6. Critical illness myopathy no change   I have personally seen and evaluated the patient, evaluated laboratory and imaging results, formulated the assessment and plan and placed orders. The Patient requires high complexity decision making with multiple systems involvement.  Rounds were done with the Respiratory Therapy Director and Staff therapists and discussed with nursing staff also.  Allyne Gee, MD [facility] Pulmonary Critical Care Medicine Sleep Medicine

## 2020-08-23 DIAGNOSIS — G7281 Critical illness myopathy: Secondary | ICD-10-CM | POA: Diagnosis not present

## 2020-08-23 DIAGNOSIS — R1312 Dysphagia, oropharyngeal phase: Secondary | ICD-10-CM | POA: Diagnosis not present

## 2020-08-23 DIAGNOSIS — U071 COVID-19: Secondary | ICD-10-CM | POA: Diagnosis not present

## 2020-08-23 DIAGNOSIS — J9621 Acute and chronic respiratory failure with hypoxia: Secondary | ICD-10-CM | POA: Diagnosis not present

## 2020-08-23 NOTE — Progress Notes (Signed)
Pulmonary Critical Care Medicine [facility] [place]   PULMONARY CRITICAL CARE SERVICE  PROGRESS NOTE  Date of Service: 08/23/2020  Willie Frederick  [id number]  DOB: January 21, 1961   DOA: 08/11/2020  Referring Physician: Carron Curie, MD  HPI: Willie Frederick is a 60 y.o. male seen for follow up of Acute on Chronic Respiratory Failure.  Patient currently is on T collar has been on 40% FiO2 with good saturations  Medications: Reviewed on Rounds  Physical Exam:  Vitals: Temperature is 97.0 pulse 90 respiratory rate 21 blood pressure is 118/74 saturations 93%  Ventilator Settings on T collar with an FiO2 of 40%  . General: Comfortable at this time . Eyes: Grossly normal lids, irises & conjunctiva . ENT: grossly tongue is normal . Neck: no obvious mass . Cardiovascular: S1 S2 normal no gallop . Respiratory: Scattered rhonchi expansion is equal . Abdomen: soft . Skin: no rash seen on limited exam . Musculoskeletal: not rigid . Psychiatric:unable to assess . Neurologic: no seizure no involuntary movements         Lab Data:   Basic Metabolic Panel: Recent Labs  Lab 08/20/20 0607  NA 137  K 4.3  CL 97*  CO2 29  GLUCOSE 184*  BUN 24*  CREATININE 0.59*  CALCIUM 8.3*  MG 2.0    ABG: No results for input(s): PHART, PCO2ART, PO2ART, HCO3, O2SAT in the last 168 hours.  Liver Function Tests: No results for input(s): AST, ALT, ALKPHOS, BILITOT, PROT, ALBUMIN in the last 168 hours. No results for input(s): LIPASE, AMYLASE in the last 168 hours. No results for input(s): AMMONIA in the last 168 hours.  CBC: Recent Labs  Lab 08/20/20 0607  WBC 9.3  HGB 12.2*  HCT 36.4*  MCV 87.1  PLT 154    Cardiac Enzymes: No results for input(s): CKTOTAL, CKMB, CKMBINDEX, TROPONINI in the last 168 hours.  BNP (last 3 results) No results for input(s): BNP in the last 8760 hours.  ProBNP (last 3 results) No results for input(s): PROBNP in the last 8760  hours.  Radiological Exams: No results found.  Assessment/Plan Active Problems:   Acute on chronic respiratory failure with hypoxia (HCC)   COVID-19 virus infection   Oropharyngeal dysphagia   Severe sepsis (HCC)   Acute respiratory distress syndrome (ARDS) due to COVID-19 virus [facility])   Critical illness myopathy   1. Acute on chronic respiratory failure with hypoxia patient continues on T collar has been on 40% FiO2. 2. COVID-19 virus infection in recovery we will continue to follow 3. Oropharyngeal dysphagia no change 4. Severe sepsis treated and resolved 5. ARDS treated improving 6. Critical illness myopathy no change   I have personally seen and evaluated the patient, evaluated laboratory and imaging results, formulated the assessment and plan and placed orders. The Patient requires high complexity decision making with multiple systems involvement.  Rounds were done with the Respiratory Therapy Director and Staff therapists and discussed with nursing staff also.  Yevonne Pax, MD [facility] Pulmonary Critical Care Medicine Sleep Medicine

## 2020-08-24 DIAGNOSIS — U071 COVID-19: Secondary | ICD-10-CM | POA: Diagnosis not present

## 2020-08-24 DIAGNOSIS — J9621 Acute and chronic respiratory failure with hypoxia: Secondary | ICD-10-CM | POA: Diagnosis not present

## 2020-08-24 DIAGNOSIS — R1312 Dysphagia, oropharyngeal phase: Secondary | ICD-10-CM | POA: Diagnosis not present

## 2020-08-24 DIAGNOSIS — G7281 Critical illness myopathy: Secondary | ICD-10-CM | POA: Diagnosis not present

## 2020-08-24 NOTE — Progress Notes (Signed)
Pulmonary Critical Care Medicine [facility] [place]   PULMONARY CRITICAL CARE SERVICE  PROGRESS NOTE  Date of Service: 08/24/2020  Willie Frederick  [id number]  DOB: November 01, 1960   DOA: 08/11/2020  Referring Physician: Carron Curie, MD  HPI: Willie Frederick is a 60 y.o. male seen for follow up of Acute on Chronic Respiratory Failure.  Patient currently is on T collar has been on 40% FiO2 should be ready for capping  Medications: Reviewed on Rounds  Physical Exam:  Vitals: Temperature is 97.4 pulse 94 respiratory rate 30 blood pressure is 117/72 saturations 97%  Ventilator Settings on T collar FiO2 40%  . General: Comfortable at this time . Eyes: Grossly normal lids, irises & conjunctiva . ENT: grossly tongue is normal . Neck: no obvious mass . Cardiovascular: S1 S2 normal no gallop . Respiratory: No rhonchi coarse breath sounds . Abdomen: soft . Skin: no rash seen on limited exam . Musculoskeletal: not rigid . Psychiatric:unable to assess . Neurologic: no seizure no involuntary movements         Lab Data:   Basic Metabolic Panel: Recent Labs  Lab 08/20/20 0607  NA 137  K 4.3  CL 97*  CO2 29  GLUCOSE 184*  BUN 24*  CREATININE 0.59*  CALCIUM 8.3*  MG 2.0    ABG: No results for input(s): PHART, PCO2ART, PO2ART, HCO3, O2SAT in the last 168 hours.  Liver Function Tests: No results for input(s): AST, ALT, ALKPHOS, BILITOT, PROT, ALBUMIN in the last 168 hours. No results for input(s): LIPASE, AMYLASE in the last 168 hours. No results for input(s): AMMONIA in the last 168 hours.  CBC: Recent Labs  Lab 08/20/20 0607  WBC 9.3  HGB 12.2*  HCT 36.4*  MCV 87.1  PLT 154    Cardiac Enzymes: No results for input(s): CKTOTAL, CKMB, CKMBINDEX, TROPONINI in the last 168 hours.  BNP (last 3 results) No results for input(s): BNP in the last 8760 hours.  ProBNP (last 3 results) No results for input(s): PROBNP in the last 8760  hours.  Radiological Exams: No results found.  Assessment/Plan Active Problems:   Acute on chronic respiratory failure with hypoxia (HCC)   COVID-19 virus infection   Oropharyngeal dysphagia   Severe sepsis (HCC)   Acute respiratory distress syndrome (ARDS) due to COVID-19 virus [facility])   Critical illness myopathy   1. Acute on chronic respiratory failure hypoxia plan is to continue with the T-piece right now on 40% FiO2. 2. COVID-19 virus infection in recovery we will continue with supportive care 3. Oropharyngeal dysphagia has baseline 4. Severe sepsis treated and resolved 5. ARDS improving 6. Critical illness myopathy no change   I have personally seen and evaluated the patient, evaluated laboratory and imaging results, formulated the assessment and plan and placed orders. The Patient requires high complexity decision making with multiple systems involvement.  Rounds were done with the Respiratory Therapy Director and Staff therapists and discussed with nursing staff also.  Yevonne Pax, MD [facility] Pulmonary Critical Care Medicine Sleep Medicine

## 2020-08-25 ENCOUNTER — Other Ambulatory Visit (HOSPITAL_COMMUNITY): Payer: Medicare Other

## 2020-08-25 DIAGNOSIS — J9621 Acute and chronic respiratory failure with hypoxia: Secondary | ICD-10-CM | POA: Diagnosis not present

## 2020-08-25 DIAGNOSIS — G7281 Critical illness myopathy: Secondary | ICD-10-CM | POA: Diagnosis not present

## 2020-08-25 DIAGNOSIS — U071 COVID-19: Secondary | ICD-10-CM | POA: Diagnosis not present

## 2020-08-25 DIAGNOSIS — R1312 Dysphagia, oropharyngeal phase: Secondary | ICD-10-CM | POA: Diagnosis not present

## 2020-08-25 NOTE — Progress Notes (Signed)
Pulmonary Critical Care Medicine [facility] [place]   PULMONARY CRITICAL CARE SERVICE  PROGRESS NOTE  Date of Service: 08/25/2020  Willie Frederick  [id number]:[id number]  DOB: 03-01-1961   DOA: 08/11/2020  Referring Physician: Carron Curie, MD  HPI: Willie Frederick is a 60 y.o. male seen for follow up of Acute on Chronic Respiratory Failure.  Patient currently is capping completing 24 hours doing well  Medications: Reviewed on Rounds  Physical Exam:  Vitals: Temperature 98.0 pulse 94 respiratory 24 blood pressure is 121/62 saturations 98%  Ventilator Settings capping off the ventilator  . General: Comfortable at this time . Eyes: Grossly normal lids, irises & conjunctiva . ENT: grossly tongue is normal . Neck: no obvious mass . Cardiovascular: S1 S2 normal no gallop . Respiratory: Scattered rhonchi expansion is equal . Abdomen: soft . Skin: no rash seen on limited exam . Musculoskeletal: not rigid . Psychiatric:unable to assess . Neurologic: no seizure no involuntary movements         Lab Data:   Basic Metabolic Panel: Recent Labs  Lab 08/20/20 0607  NA 137  K 4.3  CL 97*  CO2 29  GLUCOSE 184*  BUN 24*  CREATININE 0.59*  CALCIUM 8.3*  MG 2.0    ABG: No results for input(s): PHART, PCO2ART, PO2ART, HCO3, O2SAT in the last 168 hours.  Liver Function Tests: No results for input(s): AST, ALT, ALKPHOS, BILITOT, PROT, ALBUMIN in the last 168 hours. No results for input(s): LIPASE, AMYLASE in the last 168 hours. No results for input(s): AMMONIA in the last 168 hours.  CBC: Recent Labs  Lab 08/20/20 0607  WBC 9.3  HGB 12.2*  HCT 36.4*  MCV 87.1  PLT 154    Cardiac Enzymes: No results for input(s): CKTOTAL, CKMB, CKMBINDEX, TROPONINI in the last 168 hours.  BNP (last 3 results) No results for input(s): BNP in the last 8760 hours.  ProBNP (last 3 results) No results for input(s): PROBNP in the last 8760 hours.  Radiological Exams: No  results found.  Assessment/Plan Active Problems:   Acute on chronic respiratory failure with hypoxia (HCC)   COVID-19 virus infection   Oropharyngeal dysphagia   Severe sepsis (HCC)   Acute respiratory distress syndrome (ARDS) due to COVID-19 virus [facility])   Critical illness myopathy   1. Acute on chronic respiratory failure hypoxia plan is to continue with capping trials as ordered. 2. COVID-19 virus infection recovery phase 3. Oropharyngeal dysphagia no change 4. Severe sepsis treated resolved 5. ARDS slowly improving 6. Critical illness myopathy needs ongoing therapy   I have personally seen and evaluated the patient, evaluated laboratory and imaging results, formulated the assessment and plan and placed orders. The Patient requires high complexity decision making with multiple systems involvement.  Rounds were done with the Respiratory Therapy Director and Staff therapists and discussed with nursing staff also.  Yevonne Pax, MD [facility] Pulmonary Critical Care Medicine Sleep Medicine

## 2020-08-26 DIAGNOSIS — J9621 Acute and chronic respiratory failure with hypoxia: Secondary | ICD-10-CM | POA: Diagnosis not present

## 2020-08-26 DIAGNOSIS — R1312 Dysphagia, oropharyngeal phase: Secondary | ICD-10-CM | POA: Diagnosis not present

## 2020-08-26 DIAGNOSIS — G7281 Critical illness myopathy: Secondary | ICD-10-CM | POA: Diagnosis not present

## 2020-08-26 DIAGNOSIS — U071 COVID-19: Secondary | ICD-10-CM | POA: Diagnosis not present

## 2020-08-26 NOTE — Progress Notes (Signed)
Pulmonary Critical Care Medicine [facility] [place]   PULMONARY CRITICAL CARE SERVICE  PROGRESS NOTE  Date of Service: 08/26/2020  Willie Frederick  [id number]  DOB: 1961-06-08   DOA: 08/11/2020  Referring Physician: Carron Curie, MD  HPI: Willie Frederick is a 60 y.o. male seen for follow up of Acute on Chronic Respiratory Failure.  Continues to do well as far as capping patient is ready for decannulation  Medications: Reviewed on Rounds  Physical Exam:  Vitals: Temperature is 97.8 pulse 76 respiratory rate 32 blood pressure is 130/96 saturations 98%  Ventilator Settings capping off the ventilator  . General: Comfortable at this time . Eyes: Grossly normal lids, irises & conjunctiva . ENT: grossly tongue is normal . Neck: no obvious mass . Cardiovascular: S1 S2 normal no gallop . Respiratory: No rhonchi very coarse breath sound . Abdomen: soft . Skin: no rash seen on limited exam . Musculoskeletal: not rigid . Psychiatric:unable to assess . Neurologic: no seizure no involuntary movements         Lab Data:   Basic Metabolic Panel: Recent Labs  Lab 08/20/20 0607  NA 137  K 4.3  CL 97*  CO2 29  GLUCOSE 184*  BUN 24*  CREATININE 0.59*  CALCIUM 8.3*  MG 2.0    ABG: No results for input(s): PHART, PCO2ART, PO2ART, HCO3, O2SAT in the last 168 hours.  Liver Function Tests: No results for input(s): AST, ALT, ALKPHOS, BILITOT, PROT, ALBUMIN in the last 168 hours. No results for input(s): LIPASE, AMYLASE in the last 168 hours. No results for input(s): AMMONIA in the last 168 hours.  CBC: Recent Labs  Lab 08/20/20 0607  WBC 9.3  HGB 12.2*  HCT 36.4*  MCV 87.1  PLT 154    Cardiac Enzymes: No results for input(s): CKTOTAL, CKMB, CKMBINDEX, TROPONINI in the last 168 hours.  BNP (last 3 results) No results for input(s): BNP in the last 8760 hours.  ProBNP (last 3 results) No results for input(s): PROBNP in the last 8760  hours.  Radiological Exams: No results found.  Assessment/Plan Active Problems:   Acute on chronic respiratory failure with hypoxia (HCC)   COVID-19 virus infection   Oropharyngeal dysphagia   Severe sepsis (HCC)   Acute respiratory distress syndrome (ARDS) due to COVID-19 virus [facility])   Critical illness myopathy   1. Acute on chronic respiratory failure hypoxia plan is to continue with proceeding with the weaning and proceed to decannulation 2. COVID-19 virus infection treated and resolved 3. Oropharyngeal dysphagia no change 4. Severe sepsis resolved 5. ARDS improved 6. Critical illness myopathy will need ongoing physical therapy   I have personally seen and evaluated the patient, evaluated laboratory and imaging results, formulated the assessment and plan and placed orders. The Patient requires high complexity decision making with multiple systems involvement.  Rounds were done with the Respiratory Therapy Director and Staff therapists and discussed with nursing staff also.  Yevonne Pax, MD [facility] Pulmonary Critical Care Medicine Sleep Medicine

## 2020-08-27 ENCOUNTER — Other Ambulatory Visit (HOSPITAL_COMMUNITY): Payer: Medicare Other

## 2020-08-27 DIAGNOSIS — U071 COVID-19: Secondary | ICD-10-CM | POA: Diagnosis not present

## 2020-08-27 DIAGNOSIS — G7281 Critical illness myopathy: Secondary | ICD-10-CM | POA: Diagnosis not present

## 2020-08-27 DIAGNOSIS — R1312 Dysphagia, oropharyngeal phase: Secondary | ICD-10-CM | POA: Diagnosis not present

## 2020-08-27 DIAGNOSIS — J9621 Acute and chronic respiratory failure with hypoxia: Secondary | ICD-10-CM | POA: Diagnosis not present

## 2020-08-27 LAB — CBC
HCT: 38.1 % — ABNORMAL LOW (ref 39.0–52.0)
Hemoglobin: 12.2 g/dL — ABNORMAL LOW (ref 13.0–17.0)
MCH: 28.8 pg (ref 26.0–34.0)
MCHC: 32 g/dL (ref 30.0–36.0)
MCV: 90.1 fL (ref 80.0–100.0)
Platelets: 135 10*3/uL — ABNORMAL LOW (ref 150–400)
RBC: 4.23 MIL/uL (ref 4.22–5.81)
RDW: 18 % — ABNORMAL HIGH (ref 11.5–15.5)
WBC: 14.5 10*3/uL — ABNORMAL HIGH (ref 4.0–10.5)
nRBC: 0 % (ref 0.0–0.2)

## 2020-08-27 LAB — BASIC METABOLIC PANEL
Anion gap: 9 (ref 5–15)
BUN: 19 mg/dL (ref 6–20)
CO2: 31 mmol/L (ref 22–32)
Calcium: 8.2 mg/dL — ABNORMAL LOW (ref 8.9–10.3)
Chloride: 98 mmol/L (ref 98–111)
Creatinine, Ser: 0.56 mg/dL — ABNORMAL LOW (ref 0.61–1.24)
GFR, Estimated: >60 mL/min (ref >60)
Glucose, Bld: 182 mg/dL — ABNORMAL HIGH (ref 70–99)
Potassium: 4.3 mmol/L (ref 3.5–5.1)
Sodium: 138 mmol/L (ref 135–145)

## 2020-08-27 NOTE — Progress Notes (Signed)
Pulmonary Critical Care Medicine [facility] [place]   PULMONARY CRITICAL CARE SERVICE  PROGRESS NOTE  Date of Service: 08/27/2020  Willie Frederick  [id number]  DOB: [birth year]   DOA: 08/11/2020  Referring Physician: Carron Curie, MD  HPI: Willie Frederick is a 60 y.o. male seen for follow up of Acute on Chronic Respiratory Failure.  Doing well after decannulation and is on 2 L of oxygen good saturations  Medications: Reviewed on Rounds  Physical Exam:  Vitals: Temperature is 97.0 pulse 63 respiratory rate is 16 blood pressure is 136/69 saturations 98%  Ventilator Settings decannulated on 2 L  . General: Comfortable at this time . Eyes: Grossly normal lids, irises & conjunctiva . ENT: grossly tongue is normal . Neck: no obvious mass . Cardiovascular: S1 S2 normal no gallop . Respiratory: Scattered rhonchi expansion equal . Abdomen: soft . Skin: no rash seen on limited exam . Musculoskeletal: not rigid . Psychiatric:unable to assess . Neurologic: no seizure no involuntary movements         Lab Data:   Basic Metabolic Panel: Recent Labs  Lab 08/27/20 0412  NA 138  K 4.3  CL 98  CO2 31  GLUCOSE 182*  BUN 19  CREATININE 0.56*  CALCIUM 8.2*    ABG: No results for input(s): PHART, PCO2ART, PO2ART, HCO3, O2SAT in the last 168 hours.  Liver Function Tests: No results for input(s): AST, ALT, ALKPHOS, BILITOT, PROT, ALBUMIN in the last 168 hours. No results for input(s): LIPASE, AMYLASE in the last 168 hours. No results for input(s): AMMONIA in the last 168 hours.  CBC: Recent Labs  Lab 08/27/20 0412  WBC 14.5*  HGB 12.2*  HCT 38.1*  MCV 90.1  PLT 135*    Cardiac Enzymes: No results for input(s): CKTOTAL, CKMB, CKMBINDEX, TROPONINI in the last 168 hours.  BNP (last 3 results) No results for input(s): BNP in the last 8760 hours.  ProBNP (last 3 results) No results for input(s): PROBNP in the last 8760 hours.  Radiological  Exams: DG CHEST PORT 1 VIEW  Result Date: 08/27/2020 CLINICAL DATA:  Respiratory failure. EXAM: PORTABLE CHEST 1 VIEW COMPARISON:  08/21/2020 and 08/12/2020 FINDINGS: Tracheostomy tube has been removed. Patchy parenchymal lung densities have minimally changed. Heart size is stable. Negative for pneumothorax. Heart size is normal and stable. IMPRESSION: 1. Minimal change in the bilateral patchy parenchymal lung densities. Findings are nonspecific but could be related to infectious etiology or sequelae of infectious or inflammatory changes. 2. Tracheostomy tube has been removed. Electronically Signed   By: Richarda Overlie M.D.   On: 08/27/2020 07:57    Assessment/Plan Active Problems:   Acute on chronic respiratory failure with hypoxia (HCC)   COVID-19 virus infection   Oropharyngeal dysphagia   Severe sepsis (HCC)   Acute respiratory distress syndrome (ARDS) due to COVID-19 virus [facility])   Critical illness myopathy   1. Acute on chronic respiratory failure hypoxia we will continue with oxygen therapy titrate as tolerated. 2. COVID-19 virus infection treated in resolution 3. Oropharyngeal dysphagia no change 4. Severe sepsis resolved 5. ARDS improving continue with supportive care   I have personally seen and evaluated the patient, evaluated laboratory and imaging results, formulated the assessment and plan and placed orders. The Patient requires high complexity decision making with multiple systems involvement.  Rounds were done with the Respiratory Therapy Director and Staff therapists and discussed with nursing staff also.  Yevonne Pax, MD [facility] Pulmonary Critical Care Medicine Sleep Medicine

## 2020-08-29 ENCOUNTER — Encounter: Payer: Self-pay | Admitting: Occupational Therapy

## 2020-08-29 ENCOUNTER — Encounter: Payer: Self-pay | Admitting: Internal Medicine

## 2020-08-29 NOTE — PMR Pre-admission (Shared)
PMR Admission Coordinator Pre-Admission Assessment  Patient: Willie Frederick is an 60 y.o., male MRN: [id number] DOB: Dec 22, 1960 Height: $RemoveBefo'6\' 3"'UyblNHHKlqc$  (1.905 m) Weight: 89.4 kg  Insurance Information HMO:     PPO:      PCP:      IPA:      80/20: yes     OTHER:  PRIMARY: Medicare A and B       Policy#: [id number]      Subscriber: [name]:       Phone#:      Fax#:  Pre-Cert#:       Employer:  Benefits:  Phone #: online     Name: verified eligibility via [employer] on 08/29/20 Eff. Date: part a and b effective 06/13/2020     Deduct: $1,556      Out of Pocket Max: NA      Life Max: NA CIR: Covered per Medicare guidelines once yearly deductible has been met  *Pt may be in co-insurance days (pt/family aware)      SNF: days 1-20, 100%; days 21-100, 80% Outpatient: 80%     Co-Pay: 20% Home Health: 100%      Co-Pay:  DME: 80%     Co-Pay: 20% Providers: Pt's choice SECONDARY: Medicaid [place]      Policy#: [id number]      Phone#: [phone]  Financial Counselor:       Phone#:   The Data Collection Information Summary for patients in Inpatient Rehabilitation Facilities with attached Privacy Act [facility] Records was provided and verbally reviewed with: {CHL IP Patient Family YK:[id number]}  Emergency Contact Information Contact Information    Name Relation Home Work [address]   Teandre, Hamre Spouse   [phone]      Current Medical History  Patient Admitting Diagnosis: post covid debility  History of Present Illness: Pt is a 60 yo male with history of HTN and DM who was admitted to an outside hospital for COVID pneumonia on 07/03/20. The patient was started on decadron, remdesivir and oxygen and empiric doxycycline and ceftriaxone. He eventually required intubation on 07/08/20 due to agitation and desaturation. Pt developed sepsis and was placed on broad spectrum antibiotics. Pt suffered a mucous plug and required a bronchoscopy. Tracheal aspirate grew klebseilla and he was  given 5 days of cefepime. Pt became septic and then required 10 days of meropenem. Pt was transferred to [facility] on 08/11/20. He has progressed to a regular diet with thin liquids, and has been decannulated. Pt remains on supplemental oxygen and is deconditioned with CIR recommended. Pt is to admit to CIR on ***.     Patient's medical record from [facility] has been reviewed by the rehabilitation admission coordinator and physician.  Past Medical History  Past Medical History:  Diagnosis Date   Acute on chronic respiratory failure with hypoxia (HCC)    Acute respiratory distress syndrome (ARDS) due to COVID-19 virus ([place])    COVID-19 virus infection    Critical illness myopathy    Diabetes ([place])    Diabetic neuropathy (HCC)    GERD (gastroesophageal reflux disease)    HTN (hypertension)    Oropharyngeal dysphagia    Severe sepsis ([place])     Family History   family history is not on file.  Prior Rehab/Hospitalizations Has the patient had prior rehab or hospitalizations prior to admission? Yes  Has the patient had major surgery during 100 days prior to admission? Yes   Current Medications No current outpatient medications  on file.  Patients Current Diet: Diet regular diet, thin liquids  Precautions / Restrictions Precautions/Special Needs: Other Precaution Comments: watch Sp02 and HR when up    Has the patient had 2 or more falls or a fall with injury in the past year? No  Prior Activity Level Community (5-7x/wk): Independent; retired Development worker, international aid   Prior Functional Level Self Care: Did the patient need help bathing, dressing, using the toilet or eating? Independent  Indoor Mobility: Did the patient need assistance with walking from room to room (with or without device)? Independent  Stairs: Did the patient need assistance with internal or external stairs (with or without device)? Independent  Functional Cognition: Did the  patient need help planning regular tasks such as shopping or remembering to take medications? Independent  Home Assistive Devices / Equipment None   Prior Device Use: Indicate devices/aids used by the patient prior to current illness, exacerbation or injury? None of the above   Prior Functional Level Current Functional Level  Bed Mobility  Independent Min assist   Transfers  Independent  -- (Mod/Max A)   Mobility - Walk/Wheelchair  Independent  Other (Mod/Max A)   Upper Body Dressing  Independent  -- (***)   Lower Body Dressing  Independent  -- (***)   Grooming  Independent    ***   Eating/Drinking  Independent    ***   Toilet Transfer  Independent    ***   Bladder Continence   continent  continent   Bowel Management  continent  contintent ***   Stair Climbing  Independent  -- (NT)   Communication  WNL  WNL   Memory  WNL WNL     Special Needs/ Care Considerations Continuous Drip IV  ***, Oxygen on [place] at rest and up to 6L during activity , Special Bed : air overlay, Trach size : recently decannulated, Skin sacrococcyxgeal (unstagable); Left latearl metatarsal (pressure injury), Diabetic management : yes, Special service needs : monitor sp02 and HR during activity , Bowel management continent and Bladder management continent  Previous Home Environment (from acute therapy documentation) Living Arrangements: Spouse/significant other; Children; Other relatives  Lives With: Spouse; Other (Comment) (two sons and nephew (nephew over 67)) Available Help at Discharge: Family Type of Home: House Home Layout: One level (split level but stairs only to sunken living room/bedroom) Home Access: Stairs to enter Entrance Stairs-Rails: Can reach both Entrance Stairs-Number of Steps: 4 Bathroom Shower/Tub: Chiropodist: Standard Bathroom Accessibility: Yes How Accessible: Accessible via walker [place]: No   Discharge Living  Setting Plans for Discharge Living Setting: House Type of Home at Discharge: House Discharge Home Layout: One level (split level; stairs only to sunken living room and bedroom) Discharge Home Access: Stairs to enter Entrance Stairs-Rails: Can reach both Entrance Stairs-Number of Steps: 4 Discharge Bathroom Shower/Tub: Tub/shower unit Discharge Bathroom Toilet: Standard Discharge Bathroom Accessibility: Yes How Accessible: Accessible via walker Does the patient have any problems obtaining your medications?: No   Social/Family/Support Systems Patient Roles: Spouse; Parent Contact Information: wife: Marylynn [phone] Anticipated Caregiver: wife Anticipated Caregiver's Contact Information: see above Ability/Limitations of Caregiver: Min A Caregiver Availability: 24/7 Discharge Plan Discussed with Primary Caregiver: Yes Is Caregiver In Agreement with Plan?: Yes Does Caregiver/Family have Issues with Lodging/Transportation while Pt is in Rehab?: No   Goals Patient/Family Goal for Rehab: PT/OT: supervision; SLP: Mod I Expected length of stay: 10-14 days Pt/Family Agrees to Admission and willing to participate: Yes Program Orientation Provided & Reviewed with  Pt/Caregiver Including Roles  & Responsibilities: Yes  Barriers to Discharge: Home environment access/layout; New oxygen  Barriers to Discharge Comments: steps to enter; oxygen use at rest and activity   Decrease burden of Care through IP rehab admission: Other NA  Possible need for SNF placement upon discharge: Not anticipated. Pt is motivated to return home and return to independence. Given pt's PLOF, age, and current level of function, anticipate he will be able to DC home at a Mod I/supervision level.   Patient Condition: I have reviewed medical records from [facility], spoken with CM, PT, and RN, and patient and spouse. I met with patient at the bedside for inpatient rehabilitation assessment.  Patient  will benefit from ongoing PT, OT and SLP, can actively participate in 3 hours of therapy a day 5 days of the week, and can make measurable gains during the admission.  Patient will also benefit from the coordinated team approach during an Inpatient Acute Rehabilitation admission.  The patient will receive intensive therapy as well as Rehabilitation physician, nursing, social worker, and care management interventions.  Due to safety, skin/wound care, disease management, medication administration, pain management and patient education the patient requires 24 hour a day rehabilitation nursing.  The patient is currently Mod/Max A with mobility and *** for basic ADLs.  Discharge setting and therapy post discharge at home with home health is anticipated.  Patient has agreed to participate in the Acute Inpatient Rehabilitation Program and will admit ***.  Preadmission Screen Completed By:  Raechel Ache, 08/29/2020 3:34 PM ______________________________________________________________________   Discussed status with Dr. Marland Kitchen on *** at *** and received approval for admission today.  Admission Coordinator:  Raechel Ache, OT, time ***Sudie Grumbling ***   Assessment/Plan: Diagnosis: 1. Does the need for close, 24 hr/day Medical supervision in concert with the patient's rehab needs make it unreasonable for this patient to be served in a less intensive setting? {yes_no_potentially:3041433} 2. Co-Morbidities requiring supervision/potential complications: *** 3. Due to {due XL:2440102}, does the patient require 24 hr/day rehab nursing? {yes_no_potentially:3041433} 4. Does the patient require coordinated care of a physician, rehab nurse, PT, OT, and SLP to address physical and functional deficits in the context of the above medical diagnosis(es)? {yes_no_potentially:3041433} Addressing deficits in the following areas: {deficits:3041436} 5. Can the patient actively participate in an intensive therapy program of at least 3 hrs of  therapy 5 days a week? {yes_no_potentially:3041433} 6. The potential for patient to make measurable gains while on inpatient rehab is {potential:3041437} 7. Anticipated functional outcomes upon discharge from inpatient rehab: {functional outcomes:304600100} PT, {functional outcomes:304600100} OT, {functional outcomes:304600100} SLP 8. Estimated rehab length of stay to reach the above functional goals is: *** 9. Anticipated discharge destination: {anticipated dc setting:21604} 10. Overall Rehab/Functional Prognosis: {potential:3041437}  MD Signature ***

## 2020-08-29 NOTE — H&P (Incomplete)
    Physical Medicine and Rehabilitation Admission H&P    No chief complaint on file.   HPI: 60 F. Shevlin is a 60 year old male with history of back surgery, T2DM with neuropathy, gout, who was originally diagnosed with Covid 19 PNA 06/29/20. Patient not vaccinated and multiple other members also positive. He was admitted to [facility] in [place] with fevers, SOB and abnormal LFTs. \ He was treated with decadron, Remdesivir and ceftriaxone/doxycycline. He became septic and developed ARDS on 11/28 and was started on broad spectrum antibiotics and intubated for airway protection--not felt to be ECMO candidate due to weight. Hospital course significant for dislodgement of ET tube as well as difficulty with extubation attempts. He underwent bronchscopy to clear mucous plugs and required tracheostomy 12/15. 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 He developed sepsis due to Klebsiella BAL treated with 10 day course of meropenum as. He was tolerating TC and was transferred to [facility] on 08/11/20 for vent wean and        ROS    Past Medical History:  Diagnosis Date  . AAA (abdominal aortic aneurysm) (HCC)   . Acute on chronic respiratory failure with hypoxia (HCC)   . Acute respiratory distress syndrome (ARDS) due to COVID-19 virus (HCC)   . COVID-19 virus infection   . Critical illness myopathy   . Diabetes (HCC)   . Diabetic neuropathy (HCC)   . GERD (gastroesophageal reflux disease)   . HTN (hypertension)   . Oropharyngeal dysphagia   . Severe sepsis [facility])     Past Surgical History:  Procedure Laterality Date  . LUMBAR FUSION    . VENTRAL HERNIA REPAIR      Family History  Problem Relation Age of Onset  . Heart attack Mother   . Heart attack Father   . Clotting disorder Maternal Aunt       Social History: Married. Disabled due to back surgery. Works in Aeronautical engineer.     Allergies  Allergen Reactions  . Cymbalta [Duloxetine  Hcl]   . Lyrica [Pregabalin]     No medications prior to admission.    Drug Regimen Review { DRUG REGIMEN GYIRSW:54627}  Home:     Functional History:    Functional Status:  Mobility:          ADL:    Cognition:      Physical Exam: There were no vitals taken for this visit. Physical Exam  No results found for this or any previous visit (from the past 48 hour(s)). No results found.     Medical Problem List and Plan: 1.  *** secondary to ***  -patient may *** shower  -ELOS/Goals: *** 2.  Antithrombotics: -DVT/anticoagulation:  {VTE PROPHYLAXIS/ANTICOAGULATION - OJJK:09381}  -antiplatelet therapy: *** 3. Pain Management: *** 4. Mood: ***  -antipsychotic agents: *** 5. Neuropsych: This patient *** capable of making decisions on *** own behalf. 6. Skin/Wound Care: *** 7. Fluids/Electrolytes/Nutrition: *** 8. T2DM: 9. HTN: 10 Leucocytosis: 11. Anemia of critical illness: 12. Dysphagia: 13. GERD:  14. Chronic insomnia:    ***  Jacquelynn Cree, PA-C 08/29/2020

## 2020-09-01 LAB — NOVEL CORONAVIRUS, NAA (HOSP ORDER, SEND-OUT TO REF LAB; TAT 18-24 HRS): SARS-CoV-2, NAA: NOT DETECTED

## 2022-09-06 IMAGING — DX DG ABD PORTABLE 1V
1 series · 1 of 1 positions shown · non-contrast
Comparison: None.

CLINICAL DATA: Impaired nasogastric feeding tube, initial
encounter.

Status post NG tube placement.
EXAM:
PORTABLE ABDOMEN - 1 VIEW

[abdomen]
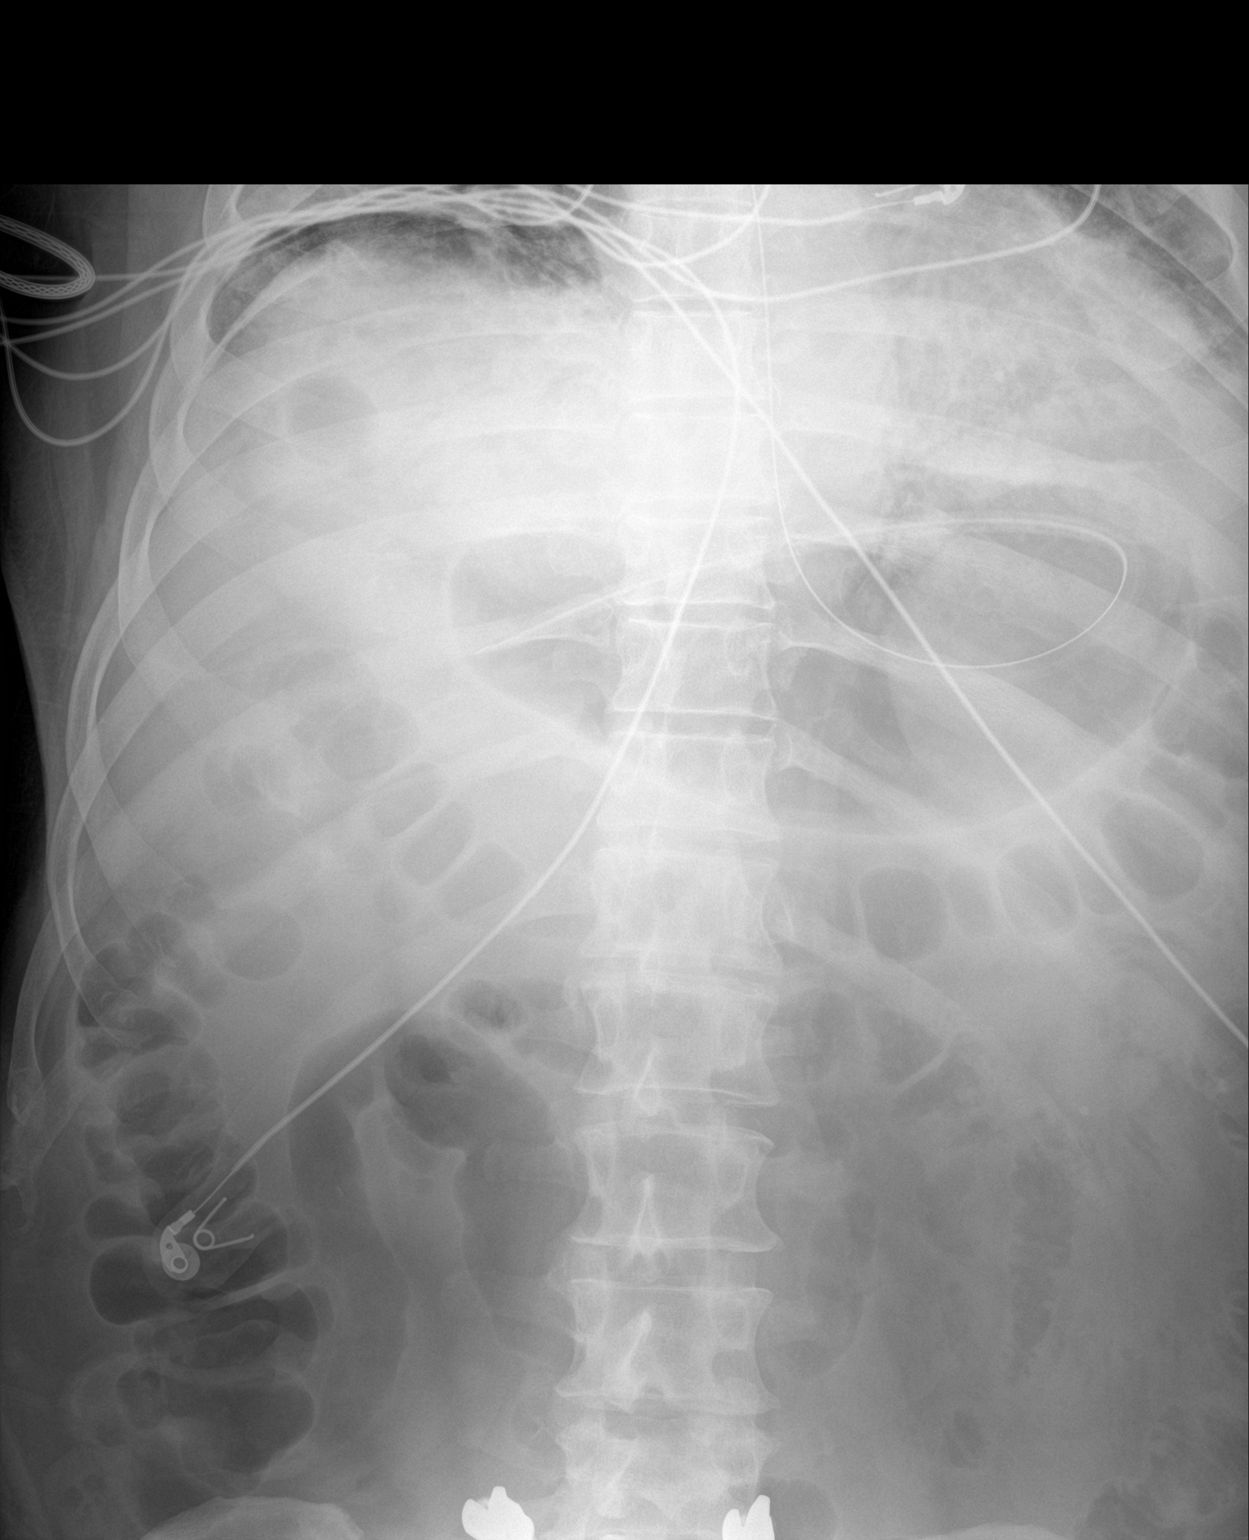

[1 of 1 positions shown; findings below may reference images not displayed]

FINDINGS: Tip and side port of the enteric tube are in the stomach. The
enteric tube is looped in the stomach, tip in the region of the
pylorus. There is no kink in the enteric tube. Mild gaseous gastric
distension. Air within nondilated small and large bowel,
nonobstructive. Patchy bilateral lung opacities in the included lung
bases.
IMPRESSION: Enteric tube looped in the stomach with the tip in the region of the
pylorus.
